# Patient Record
Sex: Female | Born: 1980 | Race: Black or African American | Hispanic: No | Marital: Single
Health system: Southern US, Community
[De-identification: ages and names within clinical notes are randomized; demographics above are authoritative.]

---

## 2019-11-26 ENCOUNTER — Other Ambulatory Visit: Payer: Self-pay | Admitting: *Deleted

## 2019-11-26 ENCOUNTER — Encounter: Payer: Self-pay | Admitting: *Deleted

## 2019-11-26 DIAGNOSIS — Z20822 Contact with and (suspected) exposure to covid-19: Secondary | ICD-10-CM

## 2019-11-27 LAB — NOVEL CORONAVIRUS, NAA: SARS-CoV-2, NAA: NOT DETECTED

## 2019-12-17 ENCOUNTER — Other Ambulatory Visit: Payer: Self-pay

## 2019-12-17 DIAGNOSIS — Z20822 Contact with and (suspected) exposure to covid-19: Secondary | ICD-10-CM

## 2019-12-19 LAB — NOVEL CORONAVIRUS, NAA: SARS-CoV-2, NAA: NOT DETECTED

## 2020-01-07 ENCOUNTER — Other Ambulatory Visit: Payer: Self-pay | Admitting: *Deleted

## 2020-01-07 DIAGNOSIS — Z20822 Contact with and (suspected) exposure to covid-19: Secondary | ICD-10-CM

## 2020-01-08 LAB — NOVEL CORONAVIRUS, NAA: SARS-CoV-2, NAA: NOT DETECTED

## 2020-01-10 ENCOUNTER — Telehealth: Payer: Self-pay

## 2020-01-10 NOTE — Telephone Encounter (Signed)
Pt notified of negative COVID-19 results. Understanding verbalized.  Chasta M Hopkins   

## 2020-03-02 ENCOUNTER — Emergency Department (HOSPITAL_COMMUNITY): Payer: Medicaid Other

## 2020-03-02 ENCOUNTER — Emergency Department (HOSPITAL_COMMUNITY)
Admission: EM | Admit: 2020-03-02 | Discharge: 2020-03-02 | Disposition: A | Discharge status: Home / Self Care | Payer: Medicaid Other | Attending: Emergency Medicine | Admitting: Emergency Medicine

## 2020-03-02 ENCOUNTER — Encounter (HOSPITAL_COMMUNITY): Payer: Self-pay | Admitting: Emergency Medicine

## 2020-03-02 ENCOUNTER — Other Ambulatory Visit: Payer: Self-pay

## 2020-03-02 DIAGNOSIS — Z20822 Contact with and (suspected) exposure to covid-19: Secondary | ICD-10-CM | POA: Diagnosis not present

## 2020-03-02 DIAGNOSIS — A084 Viral intestinal infection, unspecified: Secondary | ICD-10-CM | POA: Diagnosis not present

## 2020-03-02 DIAGNOSIS — R1032 Left lower quadrant pain: Secondary | ICD-10-CM | POA: Diagnosis present

## 2020-03-02 LAB — URINALYSIS, ROUTINE W REFLEX MICROSCOPIC
Bilirubin Urine: NEGATIVE
Glucose, UA: NEGATIVE mg/dL
Hgb urine dipstick: NEGATIVE
Ketones, ur: NEGATIVE mg/dL
Nitrite: NEGATIVE
Protein, ur: NEGATIVE mg/dL
Specific Gravity, Urine: 1.023 (ref 1.005–1.030)
pH: 6 (ref 5.0–8.0)

## 2020-03-02 LAB — CBC WITH DIFFERENTIAL/PLATELET
Abs Immature Granulocytes: 0.04 10*3/uL (ref 0.00–0.07)
Basophils Absolute: 0.1 10*3/uL (ref 0.0–0.1)
Basophils Relative: 1 %
Eosinophils Absolute: 0.2 10*3/uL (ref 0.0–0.5)
Eosinophils Relative: 2 %
HCT: 40 % (ref 36.0–46.0)
Hemoglobin: 12.5 g/dL (ref 12.0–15.0)
Immature Granulocytes: 0 %
Lymphocytes Relative: 36 %
Lymphs Abs: 3.8 10*3/uL (ref 0.7–4.0)
MCH: 25.7 pg — ABNORMAL LOW (ref 26.0–34.0)
MCHC: 31.3 g/dL (ref 30.0–36.0)
MCV: 82.1 fL (ref 80.0–100.0)
Monocytes Absolute: 0.6 10*3/uL (ref 0.1–1.0)
Monocytes Relative: 6 %
Neutro Abs: 5.7 10*3/uL (ref 1.7–7.7)
Neutrophils Relative %: 55 %
Platelets: 259 10*3/uL (ref 150–400)
RBC: 4.87 MIL/uL (ref 3.87–5.11)
RDW: 15.2 % (ref 11.5–15.5)
WBC: 10.4 10*3/uL (ref 4.0–10.5)
nRBC: 0 % (ref 0.0–0.2)

## 2020-03-02 LAB — COMPREHENSIVE METABOLIC PANEL
ALT: 15 U/L (ref 0–44)
AST: 14 U/L — ABNORMAL LOW (ref 15–41)
Albumin: 4 g/dL (ref 3.5–5.0)
Alkaline Phosphatase: 73 U/L (ref 38–126)
Anion gap: 5 (ref 5–15)
BUN: 14 mg/dL (ref 6–20)
CO2: 24 mmol/L (ref 22–32)
Calcium: 8.7 mg/dL — ABNORMAL LOW (ref 8.9–10.3)
Chloride: 111 mmol/L (ref 98–111)
Creatinine, Ser: 0.62 mg/dL (ref 0.44–1.00)
GFR calc Af Amer: >60 mL/min (ref >60)
GFR calc non Af Amer: >60 mL/min (ref >60)
Glucose, Bld: 85 mg/dL (ref 70–99)
Potassium: 3.7 mmol/L (ref 3.5–5.1)
Sodium: 140 mmol/L (ref 135–145)
Total Bilirubin: 0.4 mg/dL (ref 0.3–1.2)
Total Protein: 7 g/dL (ref 6.5–8.1)

## 2020-03-02 LAB — RESPIRATORY PANEL BY RT PCR (FLU A&B, COVID)
Influenza A by PCR: NEGATIVE
Influenza B by PCR: NEGATIVE
SARS Coronavirus 2 by RT PCR: NEGATIVE

## 2020-03-02 LAB — LIPASE, BLOOD: Lipase: 25 U/L (ref 11–51)

## 2020-03-02 LAB — I-STAT BETA HCG BLOOD, ED (MC, WL, AP ONLY): I-stat hCG, quantitative: <5 m[IU]/mL (ref <5)

## 2020-03-02 LAB — POC SARS CORONAVIRUS 2 AG -  ED: SARS Coronavirus 2 Ag: NEGATIVE

## 2020-03-02 MED ORDER — ONDANSETRON HCL 4 MG/2ML IJ SOLN
4.0000 mg | Freq: Once | INTRAMUSCULAR | Status: DC
  Filled 2020-03-02: qty 2

## 2020-03-02 MED ORDER — PROMETHAZINE HCL 25 MG/ML IJ SOLN
12.5000 mg | Freq: Once | INTRAMUSCULAR | Status: AC
  Administered 2020-03-02: 12.5 mg via INTRAVENOUS
  Filled 2020-03-02: qty 1

## 2020-03-02 MED ORDER — FENTANYL CITRATE (PF) 100 MCG/2ML IJ SOLN
25.0000 ug | Freq: Once | INTRAMUSCULAR | Status: AC
  Administered 2020-03-02: 18:00:00 25 ug via INTRAVENOUS
  Filled 2020-03-02: qty 2

## 2020-03-02 MED ORDER — IOHEXOL 9 MG/ML PO SOLN
ORAL | Status: AC
  Filled 2020-03-02: qty 500

## 2020-03-02 MED ORDER — FENTANYL CITRATE (PF) 100 MCG/2ML IJ SOLN
25.0000 ug | Freq: Once | INTRAMUSCULAR | Status: AC
  Administered 2020-03-02: 25 ug via INTRAVENOUS
  Filled 2020-03-02: qty 2

## 2020-03-02 MED ORDER — SODIUM CHLORIDE 0.9% FLUSH
3.0000 mL | Freq: Once | INTRAVENOUS | Status: AC
  Administered 2020-03-02: 3 mL via INTRAVENOUS

## 2020-03-02 MED ORDER — DICYCLOMINE HCL 20 MG PO TABS: 20.0000 mg | ORAL_TABLET | Freq: Two times a day (BID) | ORAL | 0 refills | Status: AC

## 2020-03-02 MED ORDER — SODIUM CHLORIDE 0.9 % IV BOLUS
1000.0000 mL | Freq: Once | INTRAVENOUS | Status: AC
  Administered 2020-03-02: 1000 mL via INTRAVENOUS

## 2020-03-02 MED ORDER — PROMETHAZINE HCL 25 MG PO TABS: 25.0000 mg | ORAL_TABLET | Freq: Four times a day (QID) | ORAL | 0 refills | Status: AC | PRN

## 2020-03-02 NOTE — ED Provider Notes (Signed)
[facility] Note   CSN: [id number] Arrival date & time: 03/02/20  1497     History Chief Complaint  Patient presents with  . Diarrhea  . Sore Throat  . Headache    Mackenzie Gardner is a 39 y.o. female with past medical history significant for DVT, pulmonary embolism, Crohn's disease, and diverticulitis who presents to the ED with a 7-day history of LLQ and lower abdominal pain, nausea and nonbloody emesis, as well as loose stools with BRBPR.  Patient states that she has been trying to manage her symptoms at home, but states that it has become terrible.  Her abdominal pain is 8 out of 10, waxes and wanes.  Patiently recently moved down here from [place], [place].  She is accompanied by her son who states that she has not been eating or drinking very well.  She admits to an extended hospitalization in the past due to a complicated diverticulitis.  She has also had an ectopic pregnancy and has since had partial hysterectomy.  Patient is also endorsing intermittent subjective fevers and chills, intermittent headache and sore throat symptoms, as well as occasional lightheadedness as if she may pass out.  She denies any chest pain or shortness of breath, exertional symptoms, pleuritic chest discomfort cough, hematemesis, pelvic pain, vaginal bleeding or discharge, or urinary symptoms.    HPI     History reviewed. No pertinent past medical history.  There are no problems to display for this patient.   History reviewed. No pertinent surgical history.   OB History   No obstetric history on file.     No family history on file.  Social History   Tobacco Use  . Smoking status: Not on file  Substance Use Topics  . Alcohol use: Not on file  . Drug use: Not on file    Home Medications Prior to Admission medications   Medication Sig Start Date End Date Taking? Authorizing Provider  dicyclomine (BENTYL) 20 MG tablet Take 1 tablet (20 mg total)  by mouth 2 (two) times daily. 03/02/20   Lorelee New, PA-C  promethazine (PHENERGAN) 25 MG tablet Take 1 tablet (25 mg total) by mouth every 6 (six) hours as needed for nausea or vomiting. 03/02/20   Lorelee New, PA-C    Allergies    Zofran [ondansetron], Compazine [prochlorperazine], and Tequin [gatifloxacin]  Review of Systems   Review of Systems  All other systems reviewed and are negative.  Physical Exam Updated Vital Signs BP 135/62   Pulse 73   Temp 98.2 F (36.8 C) (Oral)   Resp 11   SpO2 99%   Physical Exam Vitals and nursing note reviewed. Exam conducted with a chaperone present.  Constitutional:      Appearance: Normal appearance.  HENT:     Head: Normocephalic and atraumatic.  Eyes:     General: No scleral icterus.    Conjunctiva/sclera: Conjunctivae normal.  Cardiovascular:     Rate and Rhythm: Normal rate and regular rhythm.     Pulses: Normal pulses.     Heart sounds: Normal heart sounds.  Pulmonary:     Effort: Pulmonary effort is normal. No respiratory distress.     Breath sounds: Normal breath sounds. No wheezing or rales.  Abdominal:     Tenderness: There is no right CVA tenderness or left CVA tenderness.     Comments: Soft, nondistended.  TTP diffusely in lower abdomen, most notably in LLQ.  No TTP in upper  abdomen.  No tenderness in the pelvic area.  No guarding.  No overlying skin changes.  Normoactive bowel sounds.  Musculoskeletal:     Cervical back: Normal range of motion and neck supple. No rigidity.  Skin:    General: Skin is dry.  Neurological:     Mental Status: She is alert.     GCS: GCS eye subscore is 4. GCS verbal subscore is 5. GCS motor subscore is 6.  Psychiatric:        Mood and Affect: Mood normal.        Behavior: Behavior normal.        Thought Content: Thought content normal.      ED Results / Procedures / Treatments   Labs (all labs ordered are listed, but only abnormal results are displayed) Labs Reviewed    COMPREHENSIVE METABOLIC PANEL - Abnormal; Notable for the following components:      Result Value   Calcium 8.7 (*)    AST 14 (*)    All other components within normal limits  URINALYSIS, ROUTINE W REFLEX MICROSCOPIC - Abnormal; Notable for the following components:   APPearance HAZY (*)    Leukocytes,Ua TRACE (*)    Bacteria, UA FEW (*)    All other components within normal limits  CBC WITH DIFFERENTIAL/PLATELET - Abnormal; Notable for the following components:   MCH 25.7 (*)    All other components within normal limits  RESPIRATORY PANEL BY RT PCR (FLU A&B, COVID)  LIPASE, BLOOD  I-STAT BETA HCG BLOOD, ED ([facility], [facility], [facility] ONLY)  POC SARS CORONAVIRUS 2 AG -  ED    EKG EKG Interpretation  Date/Time:  Thursday March 02 2020 12:24:25 EDT Ventricular Rate:  68 PR Interval:    QRS Duration: 85 QT Interval:  411 QTC Calculation: 438 R Axis:   81 Text Interpretation: Sinus rhythm No acute changes No old tracing to compare Confirmed by Varney Biles [phone]) on 03/02/2020 1:45:36 PM   Radiology CT ABDOMEN PELVIS WO CONTRAST  Result Date: 03/02/2020 CLINICAL DATA:  Crohn disease, loose stools, dizziness, headache EXAM: CT ABDOMEN AND PELVIS WITHOUT CONTRAST TECHNIQUE: Multidetector CT imaging of the abdomen and pelvis was performed following the standard protocol without IV contrast. COMPARISON:  None. FINDINGS: Lower chest: No acute pleural or parenchymal lung disease. Hepatobiliary: No focal liver abnormality is seen. Status post cholecystectomy. No biliary dilatation. Pancreas: Unremarkable. No pancreatic ductal dilatation or surrounding inflammatory changes. Spleen: Normal in size without focal abnormality. Adrenals/Urinary Tract: There are 2 separate nonobstructing right renal calculi, largest measuring 4 mm in the lower pole. There is a single nonobstructing upper pole left renal calculus measuring 4 mm. No obstructive uropathy within either kidney. Bladder is decompressed which limits  evaluation. The adrenals are unremarkable. Stomach/Bowel: Normal appendix right lower quadrant. No bowel obstruction or ileus. No bowel wall thickening or inflammatory changes. Vascular/Lymphatic: No significant vascular findings are present. No enlarged abdominal or pelvic lymph nodes. Reproductive: Status post hysterectomy. No adnexal masses. Other: No abdominal wall hernia or abnormality. No abdominopelvic ascites. Musculoskeletal: No acute or destructive bony lesions. Reconstructed images demonstrate no additional findings. IMPRESSION: 1. Bilateral nonobstructing renal calculi. 2. Otherwise unremarkable unenhanced CT of the abdomen and pelvis. Electronically Signed   By: Randa Ngo M.D.   On: 03/02/2020 19:38    Procedures Procedures (including critical care time)  Medications Ordered in ED Medications  ondansetron (ZOFRAN) injection 4 mg (4 mg Intravenous Refused 03/02/20 1642)  iohexol (OMNIPAQUE) 9 MG/ML oral solution (has  no administration in time range)  sodium chloride flush (NS) 0.9 % injection 3 mL (3 mLs Intravenous Given 03/02/20 1749)  sodium chloride 0.9 % bolus 1,000 mL (1,000 mLs Intravenous New Bag/Given 03/02/20 1528)  fentaNYL (SUBLIMAZE) injection 25 mcg (25 mcg Intravenous Given 03/02/20 1506)  promethazine (PHENERGAN) injection 12.5 mg (12.5 mg Intravenous Given 03/02/20 1520)  fentaNYL (SUBLIMAZE) injection 25 mcg (25 mcg Intravenous Given 03/02/20 1748)  promethazine (PHENERGAN) injection 12.5 mg (12.5 mg Intravenous Given 03/02/20 1836)    ED Course  I have reviewed the triage vital signs and the nursing notes.  Pertinent labs & imaging results that were available during my care of the patient were reviewed by me and considered in my medical decision making (see chart for details).    MDM Rules/Calculators/A&P                      Patient's history physical exam was initially concerning for Crohn's flare versus diverticulitis as she is experienced both previously  and is felt similar.  Her vital signs were stable and within normal limits throughout duration of her stay here in the ED, aside from mildly elevated BP.  Her laboratory work-up was entirely unremarkable and she was managing PCR negative for COVID-19.  EKG demonstrates normal sinus rhythm, no prior EKGs to compare.  She denies any pelvic pain or vaginal discharge/bleeding.  She had recently moved down here from [place], [place] and does not have any gastroenterology or outpatient family medicine follow-up.    I personally reviewed CT obtained of her abdomen and pelvis with p.o. contrast which demonstrates no bowel wall thickening concerning for Crohn's flare or evidence of diverticulosis, little on diverticulitis.  There were a total of 3 nonobstructing bilateral kidney stones, but no acute intra-abdominal or intrapelvic pathology that could explain her symptoms of pain.  She was a particularly difficult stick which led to an extended stay here in the ED while work-up was pending.  She was given 25 mcg fentanyl x2 for her symptoms discomfort and her nausea symptoms were managed with Phenergan as she states that she had a bad reaction to Zofran years ago.  I explained her work-up to her and suggest that this is likely a viral gastroenteritis.  She is resting comfortably on my subsequent evaluation and has not demonstrated any emesis during her stay here in the ED.  We will p.o. challenge patient here in the ED before prescribing her a short course of Bentyl and Phenergan for her symptoms of discomfort.  Emphasized the importance of oral hydration to replace fluids loss through any vomiting or diarrhea.  She has not had any bowel movements during her extended time here in the ED today and do not feel as though occult testing is warranted.  Her generalized, vague symptoms are more concerning for viral pathology.  Her son was also recently tested for viral illness during the same period of time, suggestive of  communicable illness.  However, will refer her to gastroenterology so that she may get established given her history of Crohn's disease.  This patient presents with abdominal pain of unclear etiology. Their evaluation has not identified a emergent etiology for the abdominal pain. Specifically, given the very benign exam, normal laboratory studies, and lack of significant risk factors, I have a very low suspicion for appendicitis, ischemic bowel, bowel perforation, or any other life threatening disease. I have discussed with the patient the level of uncertainty with undifferentiated abdominal pain and clearly  explained the need to follow-up as noted on the discharge instructions, or return to the Emergency Department immediately if the pain worsens, develops fever, persistent and uncontrollable vomiting, or for any new symptoms or concerns. I discussed with the patient that this presentation today for abdominal pain could represent a significant risk for an acute abdominal process. Although the tests in the ED were essentially normal, there is still a possibility of a process such as appendicitis, diverticulitis, cholecystitis, ulcer, early bowel obstruction, mesenteric ischemia, kidney stone, or even kidney infection which could subsequently cause disability or death. The patient understands that they must return within 24 hours for a recheck or see their physician within 24 hours for re-exam due to the possibility of significant surgical or medical process.  Strict ED return precautions discussed.  All of the evaluation and work-up results were discussed with the patient and any family at bedside. They were provided opportunity to ask any additional questions and have none at this time. They have expressed understanding of verbal discharge instructions as well as return precautions and are agreeable to the plan.    Final Clinical Impression(s) / ED Diagnoses Final diagnoses:  Viral gastroenteritis    Rx  / DC Orders ED Discharge Orders         Ordered    dicyclomine (BENTYL) 20 MG tablet  2 times daily     03/02/20 2031    promethazine (PHENERGAN) 25 MG tablet  Every 6 hours PRN     03/02/20 2031           Lorelee New, PA-C 03/02/20 2047    Derwood Kaplan, MD 03/03/20 1511

## 2020-03-02 NOTE — Progress Notes (Signed)
PIV consult: Arrived to room, provider established site.

## 2020-03-02 NOTE — ED Notes (Signed)
Unable to collect blood from IV. Lab contacted.

## 2020-03-02 NOTE — Discharge Instructions (Signed)
Please take your Bentyl and Phenergan, as prescribed.  Please contact the gastroenterology office to schedule appointment for ongoing evaluation and management of your Crohn's disease.  I would also like you to follow-up with them should your symptoms fail to improve with conservative management.  Please also get established with a primary care provider here in [place], [place] now that you have relocated from [place].  Return to the ED or seek immediate medical attention should you experience any new or worsening symptoms.

## 2020-03-02 NOTE — ED Triage Notes (Signed)
Pt states that she has crohn's disease and had loose stools, sore throat, headache, feeling dizzy for past 4 days and hasnt gone to work.

## 2020-03-02 NOTE — ED Notes (Signed)
Unable to collect labs patient is a very hard stick and I need not feel any where to collect from.

## 2020-03-02 NOTE — ED Notes (Signed)
Encouraged patient to drink oral contrast

## 2021-08-11 IMAGING — CT CT ABD-PELV W/O CM
2 of 4 series · 17 of 46 positions shown, 19 images · non-contrast
Comparison: None.

CLINICAL DATA: Crohn disease, loose stools, dizziness, headache

EXAM:
CT ABDOMEN AND PELVIS WITHOUT CONTRAST
TECHNIQUE: Multidetector CT imaging of the abdomen and pelvis was performed
following the standard protocol without IV contrast.

[Series 2: axial st · axial · 0.88mm/px · z∈[+1157,+1522]mm · 14 of 83 slices shown, 16 images]
[im 5/83  soft-tissue]
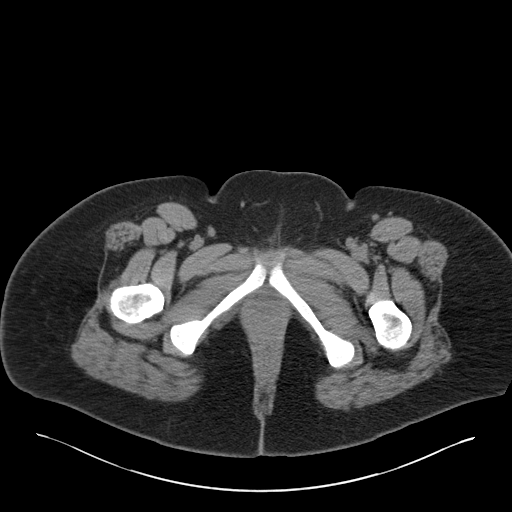
[im 5/83  bone]
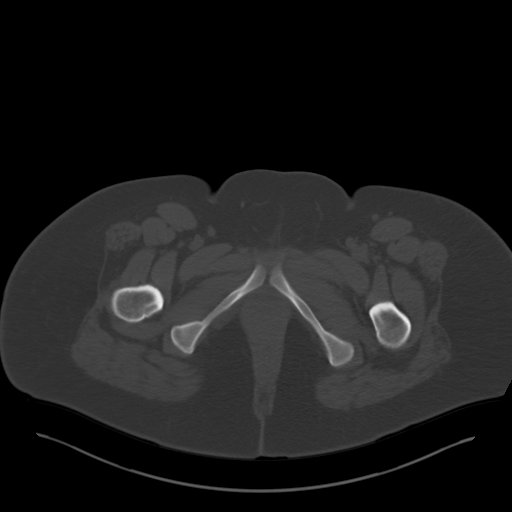
[im 9/83  soft-tissue]
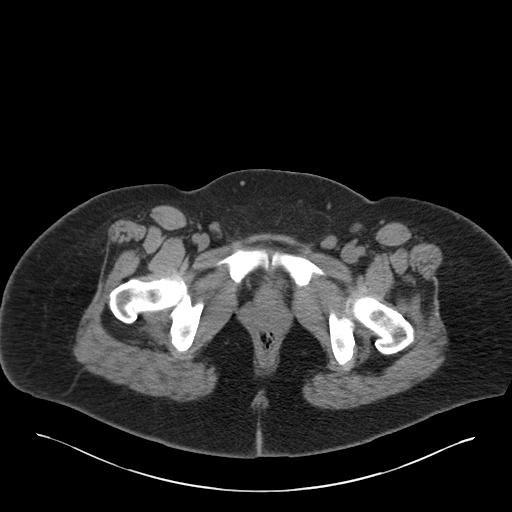
[im 18/83  soft-tissue]
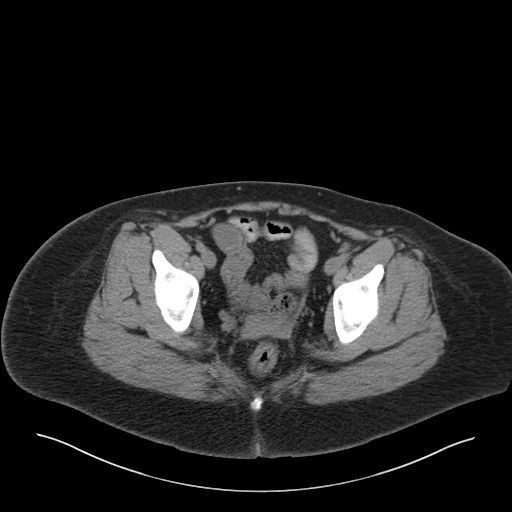
[im 22/83  soft-tissue]
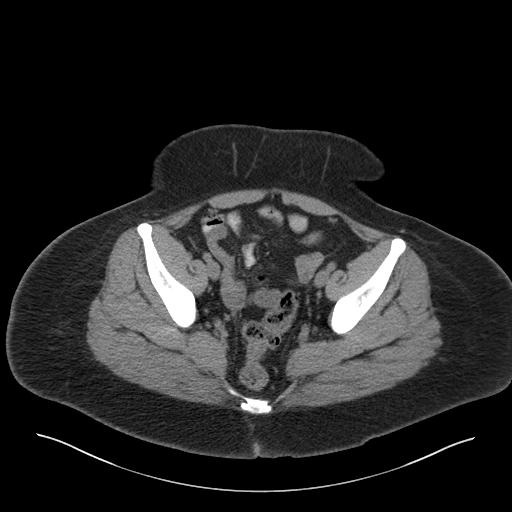
[im 26/83  soft-tissue]
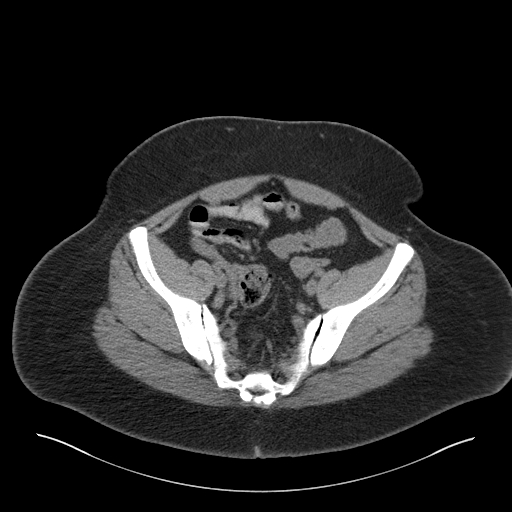
[im 35/83  soft-tissue]
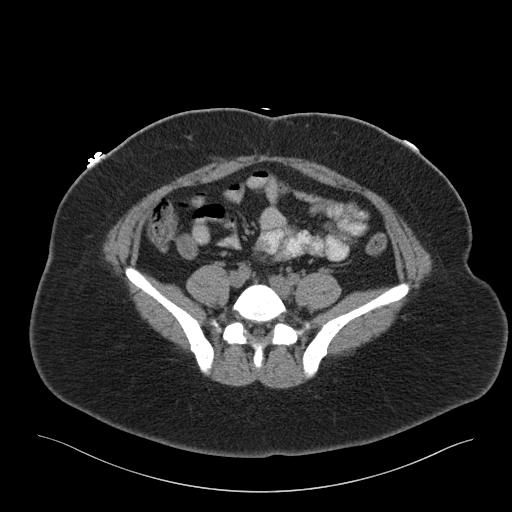
[im 39/83  soft-tissue]
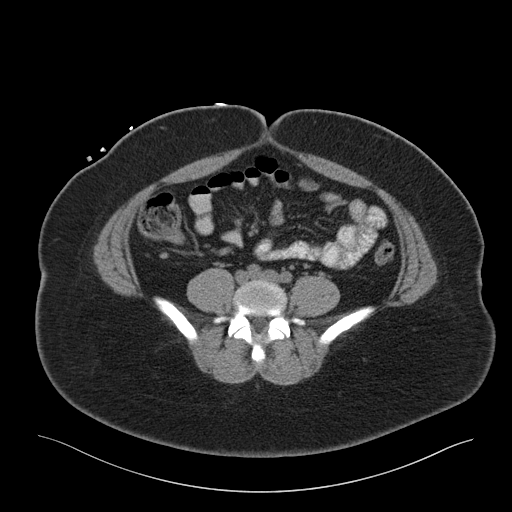
[im 44/83  soft-tissue]
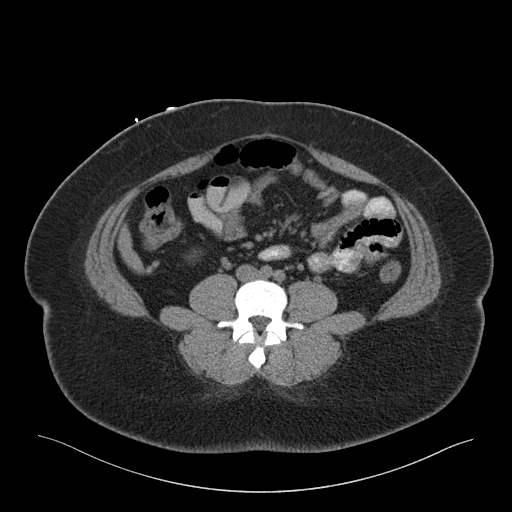
[im 48/83  soft-tissue]
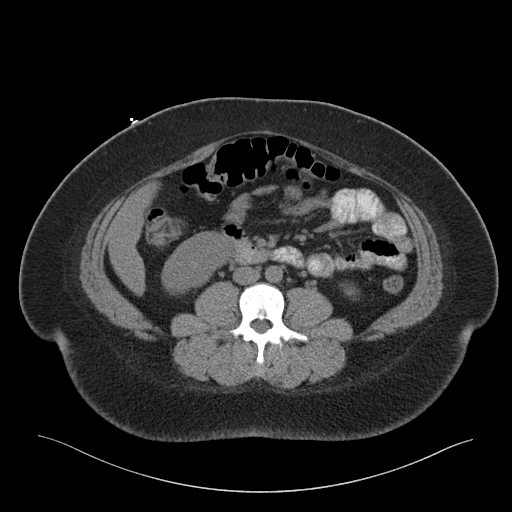
[im 48/83  bone]
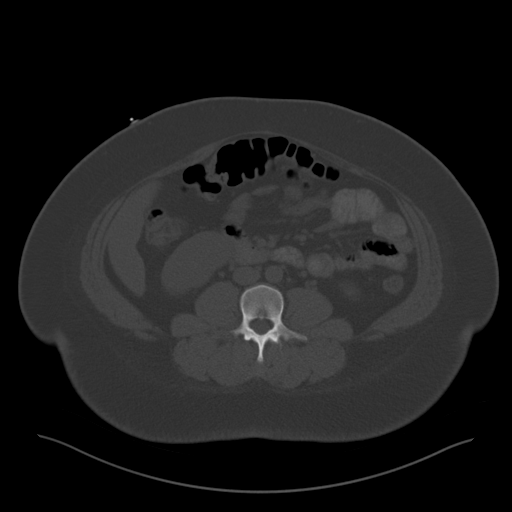
[im 57/83  soft-tissue]
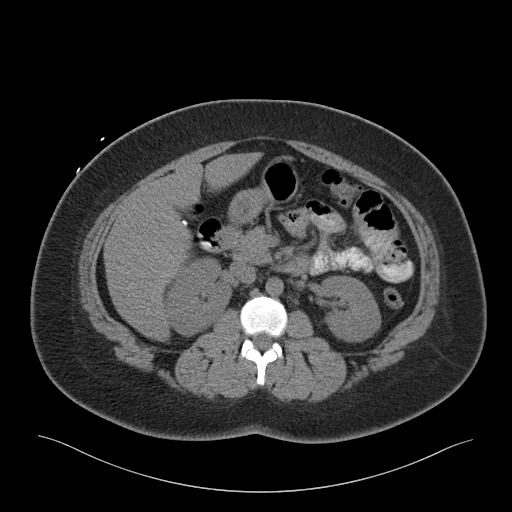
[im 61/83  soft-tissue]
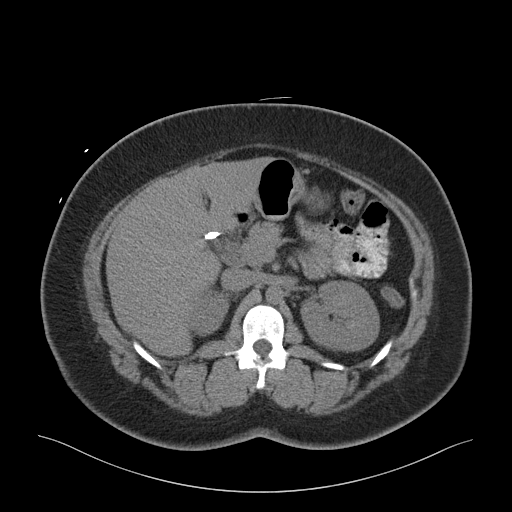
[im 65/83  soft-tissue]
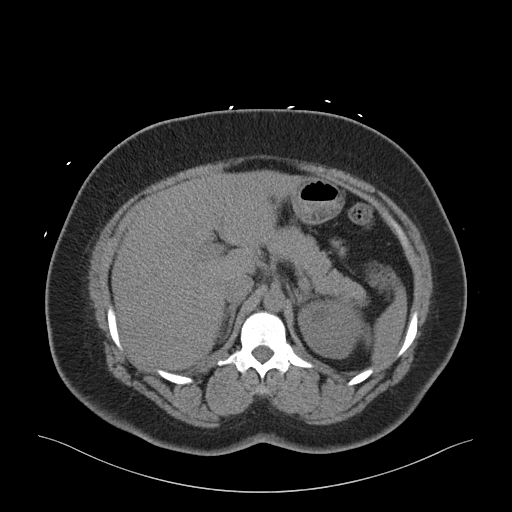
[im 74/83  soft-tissue]
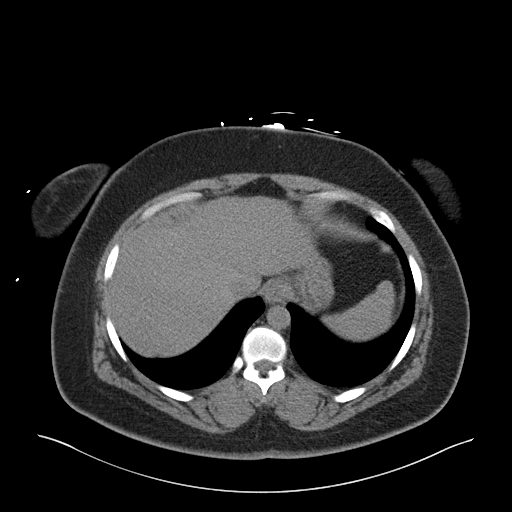
[im 78/83  soft-tissue]
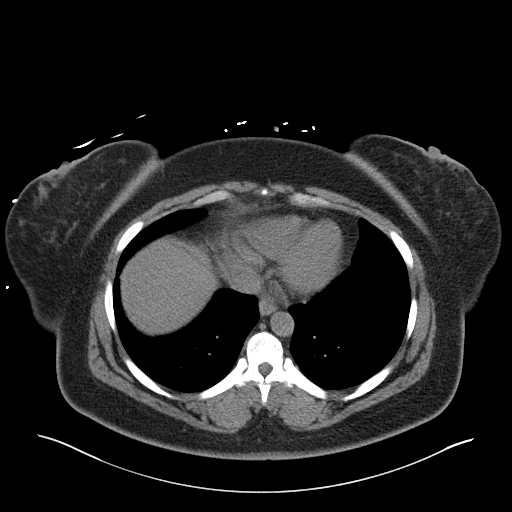

[Series 4: coronal st · coronal · 0.86mm/px · 3 of 156 slices shown]
[im 52/156  soft-tissue]
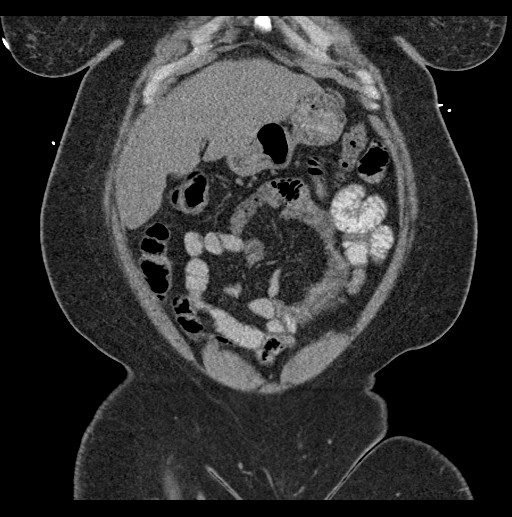
[im 69/156  soft-tissue]
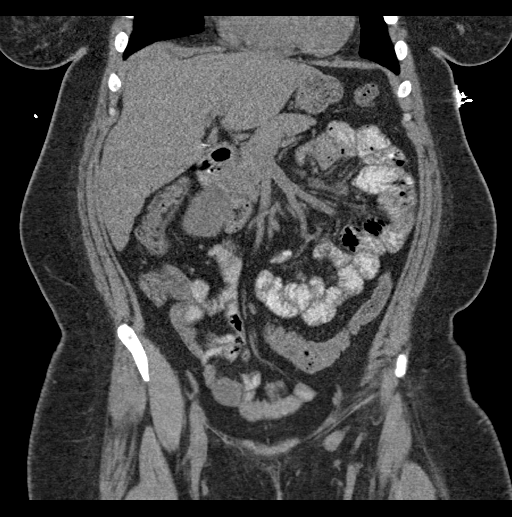
[im 87/156  soft-tissue]
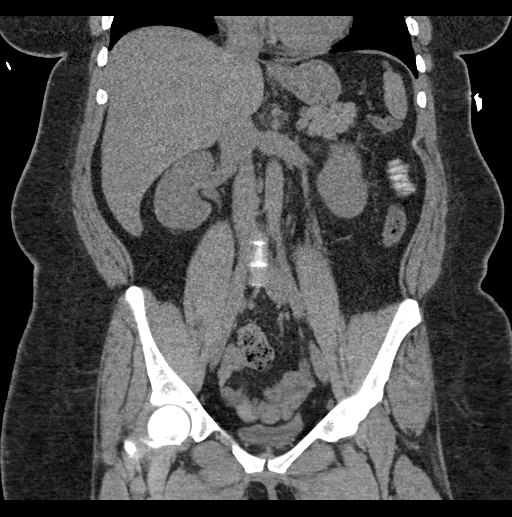

[17 of 46 positions shown; findings below may reference images not displayed]

FINDINGS: Lower chest: No acute pleural or parenchymal lung disease.

Hepatobiliary: No focal liver abnormality is seen. Status post
cholecystectomy. No biliary dilatation.

Pancreas: Unremarkable. No pancreatic ductal dilatation or
surrounding inflammatory changes.

Spleen: Normal in size without focal abnormality.

Adrenals/Urinary Tract: There are 2 separate nonobstructing right
renal calculi, largest measuring 4 mm in the lower pole. There is a
single nonobstructing upper pole left renal calculus measuring 4 mm.
No obstructive uropathy within either kidney. Bladder is
decompressed which limits evaluation. The adrenals are unremarkable.

Stomach/Bowel: Normal appendix right lower quadrant. No bowel
obstruction or ileus. No bowel wall thickening or inflammatory
changes.

Vascular/Lymphatic: No significant vascular findings are present. No
enlarged abdominal or pelvic lymph nodes.

Reproductive: Status post hysterectomy. No adnexal masses.

Other: No abdominal wall hernia or abnormality. No abdominopelvic
ascites.

Musculoskeletal: No acute or destructive bony lesions. Reconstructed
images demonstrate no additional findings.
IMPRESSION: 1. Bilateral nonobstructing renal calculi.
2. Otherwise unremarkable unenhanced CT of the abdomen and pelvis.
